# Patient Record
Sex: Female | Born: 1970 | Hispanic: No | Marital: Married | Smoking: Former smoker
Health system: Southern US, Community
[De-identification: ages and names within clinical notes are randomized; demographics above are authoritative.]

## PROBLEM LIST (undated history)

## (undated) HISTORY — PX: AUGMENTATION MAMMAPLASTY: SUR837

## (undated) HISTORY — PX: OTHER SURGICAL HISTORY: SHX169

---

## 2004-05-19 HISTORY — PX: BREAST ENHANCEMENT SURGERY: SHX7

## 2009-09-17 HISTORY — PX: SKIN CANCER EXCISION: SHX779

## 2009-09-17 HISTORY — PX: RECONSTRUCTION MID-FACE: SUR1085

## 2013-04-24 ENCOUNTER — Encounter (INDEPENDENT_AMBULATORY_CARE_PROVIDER_SITE_OTHER): Payer: Self-pay

## 2013-04-24 ENCOUNTER — Encounter (INDEPENDENT_AMBULATORY_CARE_PROVIDER_SITE_OTHER): Payer: Self-pay | Admitting: General Surgery

## 2013-04-24 ENCOUNTER — Ambulatory Visit (INDEPENDENT_AMBULATORY_CARE_PROVIDER_SITE_OTHER): Payer: BC Managed Care – PPO | Admitting: General Surgery

## 2013-04-24 ENCOUNTER — Telehealth (INDEPENDENT_AMBULATORY_CARE_PROVIDER_SITE_OTHER): Payer: Self-pay | Admitting: General Surgery

## 2013-04-24 VITALS — BP 120/62 | HR 62 | Temp 98.0°F | Resp 18 | Ht 64.0 in | Wt 115.0 lb

## 2013-04-24 DIAGNOSIS — K429 Umbilical hernia without obstruction or gangrene: Secondary | ICD-10-CM | POA: Insufficient documentation

## 2013-04-24 NOTE — Telephone Encounter (Signed)
I met with pt and went over financial responsibilities and benefits. Mrs. April Potts would like to discuss surgery dates with her husband and call me back to schedule. Placed in pending. skm
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
## 2013-04-24 NOTE — Patient Instructions (Signed)
[facility] _______[facility]  UMBILICAL HERNIA REPAIR: POST OP INSTRUCTIONS  Always review your discharge instruction sheet given to you by the facility where your surgery was performed. IF YOU HAVE DISABILITY OR FAMILY LEAVE FORMS, YOU MUST BRING THEM TO THE OFFICE FOR PROCESSING.   DO NOT GIVE THEM TO YOUR DOCTOR.  1. A  prescription for pain medication may be given to you upon discharge.  Take your pain medication as prescribed, if needed.  If narcotic pain medicine is not needed, then you may take acetaminophen (Tylenol) or ibuprofen (Advil) as needed. 2. Take your usually prescribed medications unless otherwise directed. 3. If you need a refill on your pain medication, please contact your pharmacy.  They will contact our office to request authorization. Prescriptions will not be filled after 5 pm or on week-ends. 4. You should follow a light diet the first 24 hours after arrival home, such as soup and crackers, etc.  Be sure to include lots of fluids daily.  Resume your normal diet the day after surgery. 5. Most patients will experience some swelling and bruising around the umbilicus.  Ice packs and reclining will help.  Swelling and bruising can take several days to resolve.  6. It is common to experience some constipation if taking pain medication after surgery.  Increasing fluid intake and taking a stool softener (such as Colace) will usually help or prevent this problem from occurring.  A mild laxative (Milk of Magnesia or Miralax) should be taken according to package directions if there are no bowel movements after 48 hours. 7. Unless discharge instructions indicate otherwise, you may remove your bandages 24-48 hours after surgery, and you may shower at that time.  You may have steri-strips (small skin tapes) in place directly over the incision.  These strips should be left on the skin for 7-10 days.  If your surgeon used skin glue on the incision, you may shower in 24 hours.  The  glue will flake off over the next 2-3 weeks.  Any sutures or staples will be removed at the office during your follow-up visit. 8. ACTIVITIES:  You may resume regular (light) daily activities beginning the next day-such as daily self-care, walking, climbing stairs-gradually increasing activities as tolerated.  You may have sexual intercourse when it is comfortable.  Refrain from any heavy lifting or straining until approved by your doctor. a. You may drive when you are no longer taking prescription pain medication, you can comfortably wear a seatbelt, and you can safely maneuver your car and apply brakes. b. RETURN TO WORK:  __________________________________________________________ 9. You should see your doctor in the office for a follow-up appointment approximately 2-3 weeks after your surgery.  Make sure that you call for this appointment within a day or two after you arrive home to insure a convenient appointment time. 10. OTHER INSTRUCTIONS:  __________________________________________________________________________________________________________________________________________________________________________________________  WHEN TO CALL YOUR DOCTOR: 1. Fever over 101.0 2. Inability to urinate 3. Nausea and/or vomiting 4. Extreme swelling or bruising 5. Continued bleeding from incision. 6. Increased pain, redness, or drainage from the incision  The clinic staff is available to answer your questions during regular business hours.  Please don't hesitate to call and ask to speak to one of the nurses for clinical concerns.  If you have a medical emergency, go to the nearest emergency room or call 911.  A surgeon from [facility] is always on call at the hospital   [address], [address], [place], [place]    [address] ?  [address] [address], [place], [place]   [address] [phone] ? [phone] ? FAX [phone] Web site: [email or web address] 

## 2013-04-24 NOTE — Telephone Encounter (Signed)
Noted  

## 2013-07-08 ENCOUNTER — Encounter (HOSPITAL_COMMUNITY): Payer: Self-pay | Admitting: Emergency Medicine

## 2013-07-08 ENCOUNTER — Emergency Department (HOSPITAL_COMMUNITY)
Admission: EM | Admit: 2013-07-08 | Discharge: 2013-07-09 | Disposition: A | Discharge status: Home / Self Care | Payer: BC Managed Care – PPO | Attending: Emergency Medicine | Admitting: Emergency Medicine

## 2013-07-08 DIAGNOSIS — S1093XA Contusion of unspecified part of neck, initial encounter: Principal | ICD-10-CM

## 2013-07-08 DIAGNOSIS — Z87891 Personal history of nicotine dependence: Secondary | ICD-10-CM | POA: Insufficient documentation

## 2013-07-08 DIAGNOSIS — S0083XA Contusion of other part of head, initial encounter: Secondary | ICD-10-CM

## 2013-07-08 DIAGNOSIS — S0003XA Contusion of scalp, initial encounter: Secondary | ICD-10-CM | POA: Insufficient documentation

## 2013-07-08 DIAGNOSIS — Z79899 Other long term (current) drug therapy: Secondary | ICD-10-CM | POA: Insufficient documentation
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
## 2013-07-09 ENCOUNTER — Emergency Department (HOSPITAL_COMMUNITY): Payer: BC Managed Care – PPO

## 2013-07-09 MED ORDER — IBUPROFEN 800 MG PO TABS
800.0000 mg | ORAL_TABLET | Freq: Once | ORAL | Status: AC
  Administered 2013-07-09: 800 mg via ORAL

## 2013-07-09 MED ORDER — IBUPROFEN 800 MG PO TABS
ORAL_TABLET | ORAL | Status: AC
  Filled 2013-07-09: qty 1

## 2013-07-09 NOTE — ED Provider Notes (Signed)
Medical screening examination/treatment/procedure(s) were performed by non-physician practitioner and as supervising physician I was immediately available for consultation/collaboration.  EKG Interpretation   None         Lyanne CoKevin M Rylen Swindler, MD 07/09/13 [phone]

## 2013-07-09 NOTE — ED Notes (Signed)
Patient is alert and oriented x3.  She was given DC instructions and follow up visit instructions.  Patient gave verbal understanding. She was DC ambulatory under her own power to home.  V/S stable.  He was not showing any signs of distress on DC 
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
## 2013-07-09 NOTE — Discharge Instructions (Signed)
Assault, General Assault includes any behavior, whether intentional or reckless, which results in bodily injury to another person and/or damage to property. Included in this would be any behavior, intentional or reckless, that by its nature would be understood (interpreted) by a reasonable person as intent to harm another person or to damage his/her property. Threats may be oral or written. They may be communicated through regular mail, computer, fax, or phone. These threats may be direct or implied. FORMS OF ASSAULT INCLUDE:  Physically assaulting a person. This includes physical threats to inflict physical harm as well as:  Slapping.  Hitting.  Poking.  Kicking.  Punching.  Pushing.  Arson.  Sabotage.  Equipment vandalism.  Damaging or destroying property.  Throwing or hitting objects.  Displaying a weapon or an object that appears to be a weapon in a threatening manner.  Carrying a firearm of any kind.  Using a weapon to harm someone.  Using greater physical size/strength to intimidate another.  Making intimidating or threatening gestures.  Bullying.  Hazing.  Intimidating, threatening, hostile, or abusive language directed toward another person.  It communicates the intention to engage in violence against that person. And it leads a reasonable person to expect that violent behavior may occur.  Stalking another person. IF IT HAPPENS AGAIN:  Immediately call for emergency help (911 in [place]  If someone poses clear and immediate danger to you, seek legal authorities to have a protective or restraining order put in place.  Less threatening assaults can at least be reported to authorities. STEPS TO TAKE IF A SEXUAL ASSAULT HAS HAPPENED  Go to an area of safety. This may include a shelter or staying with a friend. Stay away from the area where you have been attacked. A large percentage of sexual assaults are caused by a friend, relative or associate.  If  medications were given by your caregiver, take them as directed for the full length of time prescribed.  Only take over-the-counter or prescription medicines for pain, discomfort, or fever as directed by your caregiver.  If you have come in contact with a sexual disease, find out if you are to be tested again. If your caregiver is concerned about the HIV/AIDS virus, he/she may require you to have continued testing for several months.  For the protection of your privacy, test results can not be given over the phone. Make sure you receive the results of your test. If your test results are not back during your visit, make an appointment with your caregiver to find out the results. Do not assume everything is normal if you have not heard from your caregiver or the medical facility. It is important for you to follow up on all of your test results.  File appropriate papers with authorities. This is important in all assaults, even if it has occurred in a family or by a friend. SEEK MEDICAL CARE IF:  You have new problems because of your injuries.  You have problems that may be because of the medicine you are taking, such as:  Rash.  Itching.  Swelling.  Trouble breathing.  You develop belly (abdominal) pain, feel sick to your stomach (nausea) or are vomiting.  You begin to run a temperature.  You need supportive care or referral to a rape crisis center. These are centers with trained personnel who can help you get through this ordeal. SEEK IMMEDIATE MEDICAL CARE IF:  You are afraid of being threatened, beaten, or abused. In [place] call 911.  You  receive new injuries related to abuse.  You develop severe pain in any area injured in the assault or have any change in your condition that concerns you.  You faint or lose consciousness.  You develop chest pain or shortness of breath. Document Released: 06/05/2005 Document Revised: 08/28/2011 Document Reviewed: 01/22/2008 [facility] Patient  Information 2014 [place], [place].  Contusion A contusion is a deep bruise. Contusions happen when an injury causes bleeding under the skin. Signs of bruising include pain, puffiness (swelling), and discolored skin. The contusion may turn blue, purple, or yellow. HOME CARE   Put ice on the injured area.  Put ice in a plastic bag.  Place a towel between your skin and the bag.  Leave the ice on for 15-20 minutes, 03-04 times a day.  Only take medicine as told by your doctor.  Rest the injured area.  If possible, raise (elevate) the injured area to lessen puffiness. GET HELP RIGHT AWAY IF:   You have more bruising or puffiness.  You have pain that is getting worse.  Your puffiness or pain is not helped by medicine. MAKE SURE YOU:   Understand these instructions.  Will watch your condition.  Will get help right away if you are not doing well or get worse. Document Released: 11/22/2007 Document Revised: 08/28/2011 Document Reviewed: 04/10/2011 [facility] Patient Information 2014 [place], [place]. The CT scan, done of your face, reveals no fractures
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
## 2019-08-04 ENCOUNTER — Other Ambulatory Visit: Payer: Self-pay | Admitting: Obstetrics and Gynecology

## 2019-08-04 DIAGNOSIS — R928 Other abnormal and inconclusive findings on diagnostic imaging of breast: Secondary | ICD-10-CM

## 2019-08-11 ENCOUNTER — Ambulatory Visit
Admission: RE | Admit: 2019-08-11 | Discharge: 2019-08-11 | Disposition: A | Discharge status: Home / Self Care | Payer: 59 | Source: Ambulatory Visit | Attending: Obstetrics and Gynecology | Admitting: Obstetrics and Gynecology

## 2019-08-11 ENCOUNTER — Other Ambulatory Visit: Payer: Self-pay

## 2019-08-11 ENCOUNTER — Other Ambulatory Visit: Payer: Self-pay | Admitting: Obstetrics and Gynecology

## 2019-08-11 DIAGNOSIS — R928 Other abnormal and inconclusive findings on diagnostic imaging of breast: Secondary | ICD-10-CM

## 2019-08-11 DIAGNOSIS — N6489 Other specified disorders of breast: Secondary | ICD-10-CM

## 2019-08-15 ENCOUNTER — Other Ambulatory Visit: Payer: Self-pay | Admitting: Obstetrics and Gynecology

## 2019-08-15 DIAGNOSIS — Z9189 Other specified personal risk factors, not elsewhere classified: Secondary | ICD-10-CM

## 2020-02-09 ENCOUNTER — Other Ambulatory Visit: Payer: Self-pay | Admitting: Obstetrics and Gynecology

## 2020-02-09 ENCOUNTER — Ambulatory Visit
Admission: RE | Admit: 2020-02-09 | Discharge: 2020-02-09 | Disposition: A | Discharge status: Home / Self Care | Payer: No Typology Code available for payment source | Source: Ambulatory Visit | Attending: Obstetrics and Gynecology | Admitting: Obstetrics and Gynecology

## 2020-02-09 ENCOUNTER — Other Ambulatory Visit: Payer: Self-pay

## 2020-02-09 ENCOUNTER — Ambulatory Visit
Admission: RE | Admit: 2020-02-09 | Discharge: 2020-02-09 | Disposition: A | Discharge status: Home / Self Care | Payer: 59 | Source: Ambulatory Visit | Attending: Obstetrics and Gynecology | Admitting: Obstetrics and Gynecology

## 2020-02-09 DIAGNOSIS — N6489 Other specified disorders of breast: Secondary | ICD-10-CM

## 2020-08-12 ENCOUNTER — Ambulatory Visit
Admission: RE | Admit: 2020-08-12 | Discharge: 2020-08-12 | Disposition: A | Discharge status: Home / Self Care | Payer: No Typology Code available for payment source | Source: Ambulatory Visit | Attending: Obstetrics and Gynecology | Admitting: Obstetrics and Gynecology

## 2020-08-12 ENCOUNTER — Other Ambulatory Visit: Payer: Self-pay

## 2020-08-12 ENCOUNTER — Other Ambulatory Visit: Payer: Self-pay | Admitting: Obstetrics and Gynecology

## 2020-08-12 DIAGNOSIS — N6489 Other specified disorders of breast: Secondary | ICD-10-CM

## 2021-06-15 IMAGING — MG MM DIGITAL DIAGNOSTIC UNILAT*R* IMPLANT W/ TOMO W/ CAD
6 series · 6 of 14 positions shown · non-contrast
Comparison: Previous exam(s).

CLINICAL DATA: Short-term interval follow-up of probable benign
findings in the right breast.

EXAM:
DIGITAL DIAGNOSTIC RIGHT MAMMOGRAM WITH IMPLANTS, CAD AND TOMO
ULTRASOUND RIGHT BREAST
The patient has retropectoral implants. Standard and implant
displaced views were performed.

[R MLO]
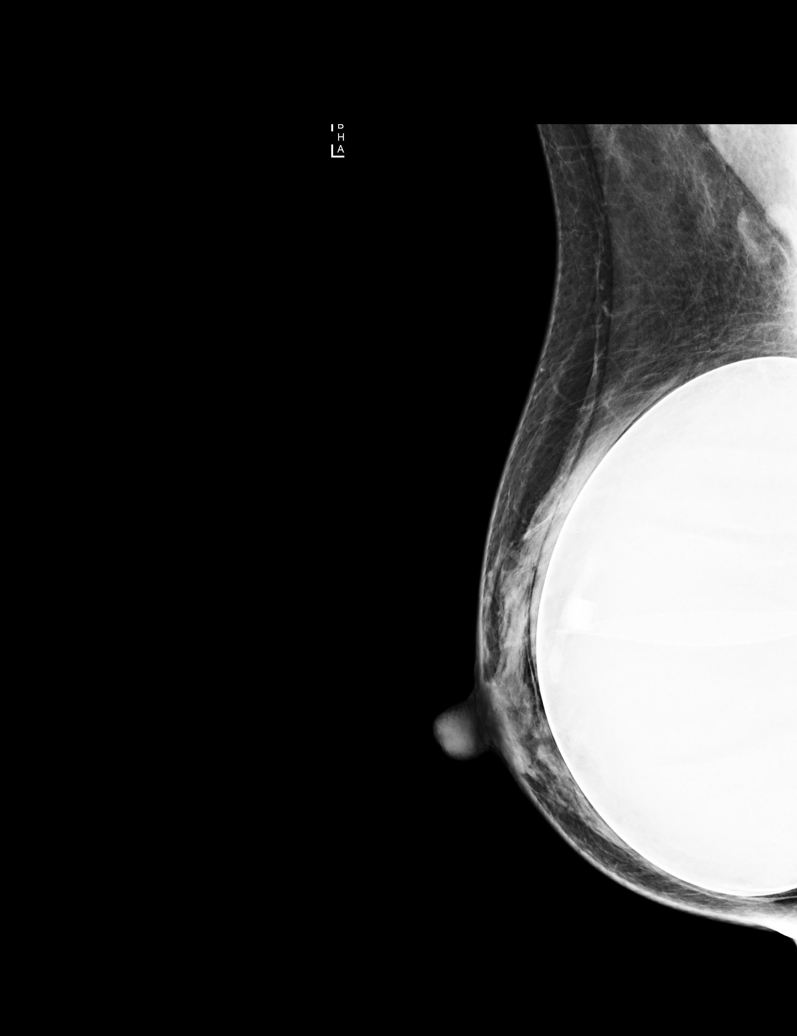

[R CC]
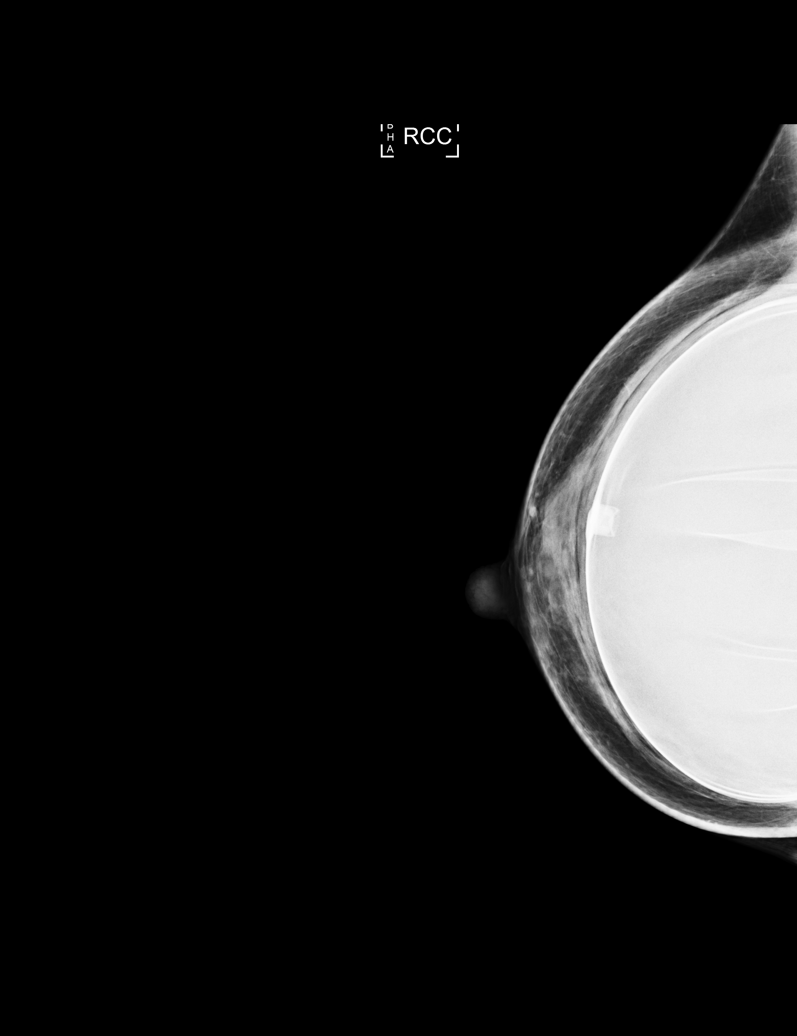

[R CC synth-2D]
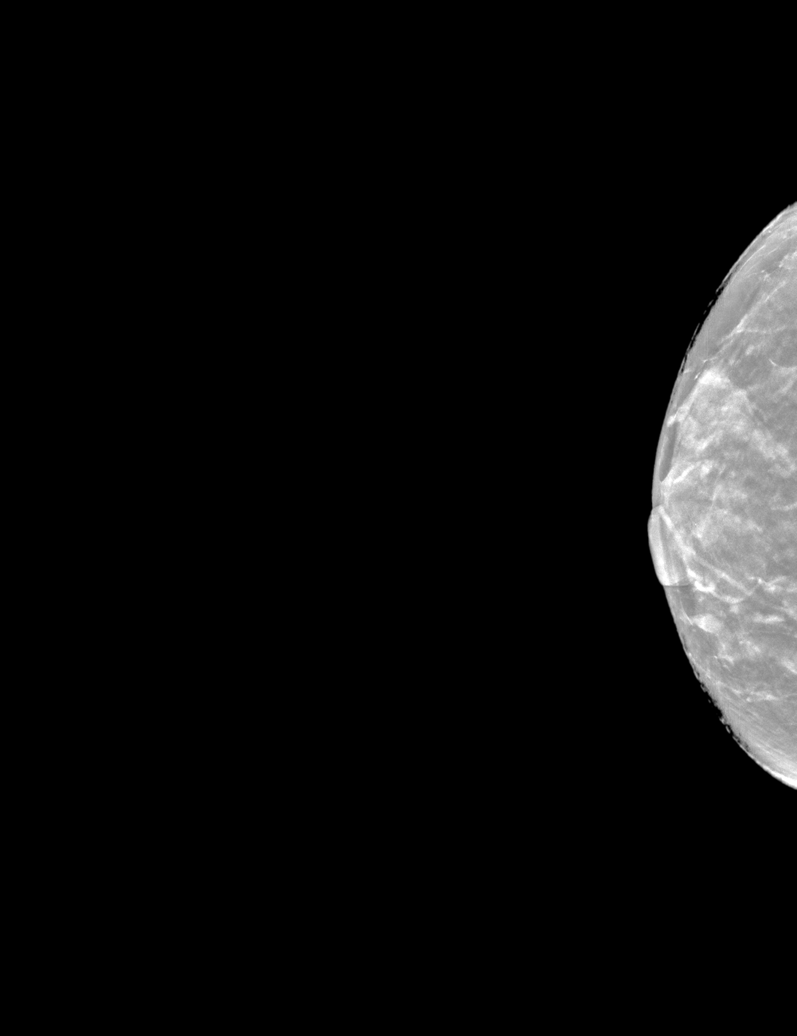

[R MLO synth-2D]
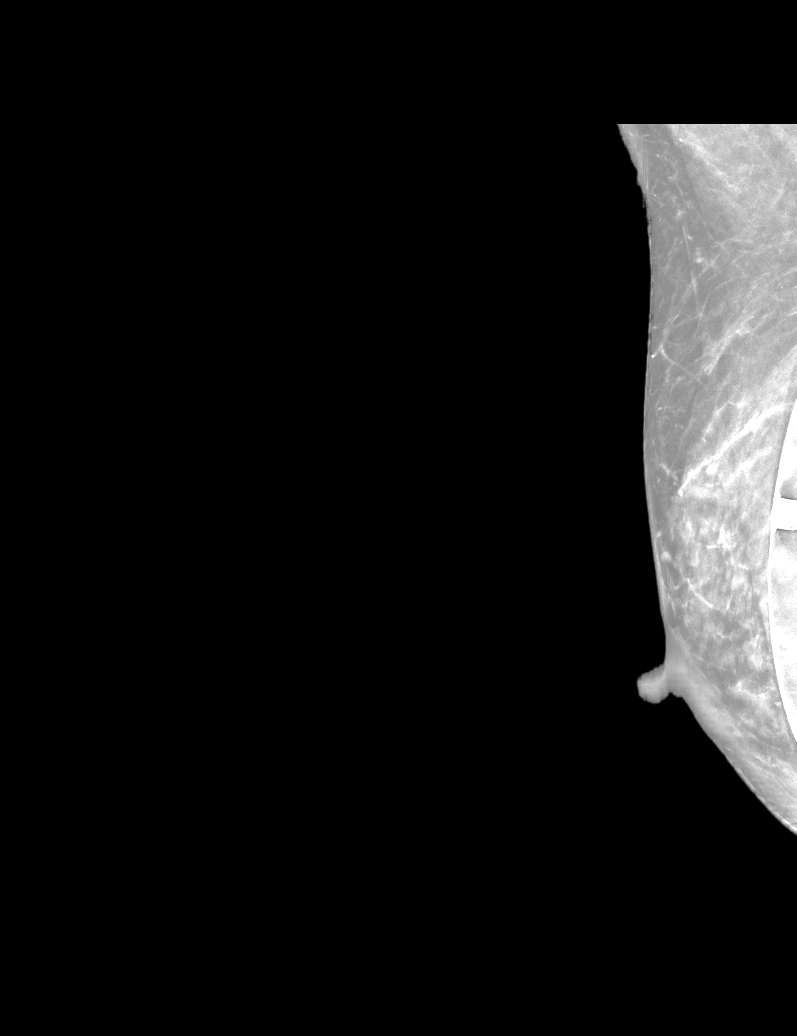

[R CCID BREAST TOMOSYNTHESIS IMAGE tomo · tomo slice 21/41.0]
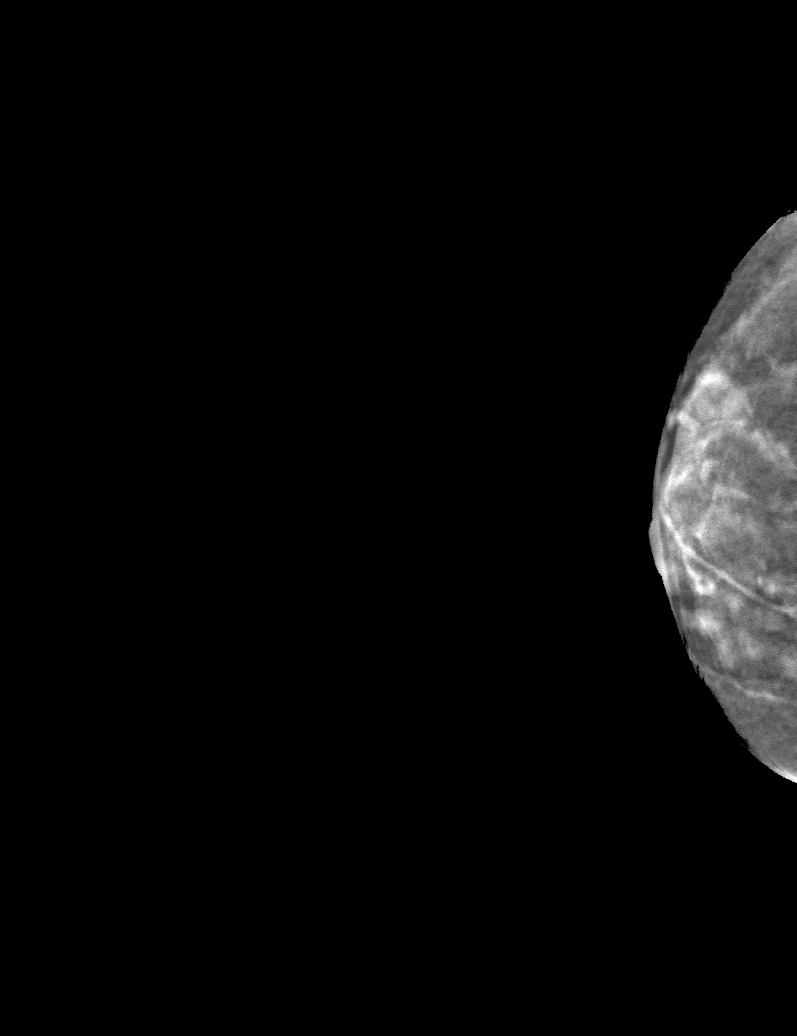

[R MLOID BREAST TOMOSYNTHESIS IMAGE tomo · tomo slice 27/54.0]
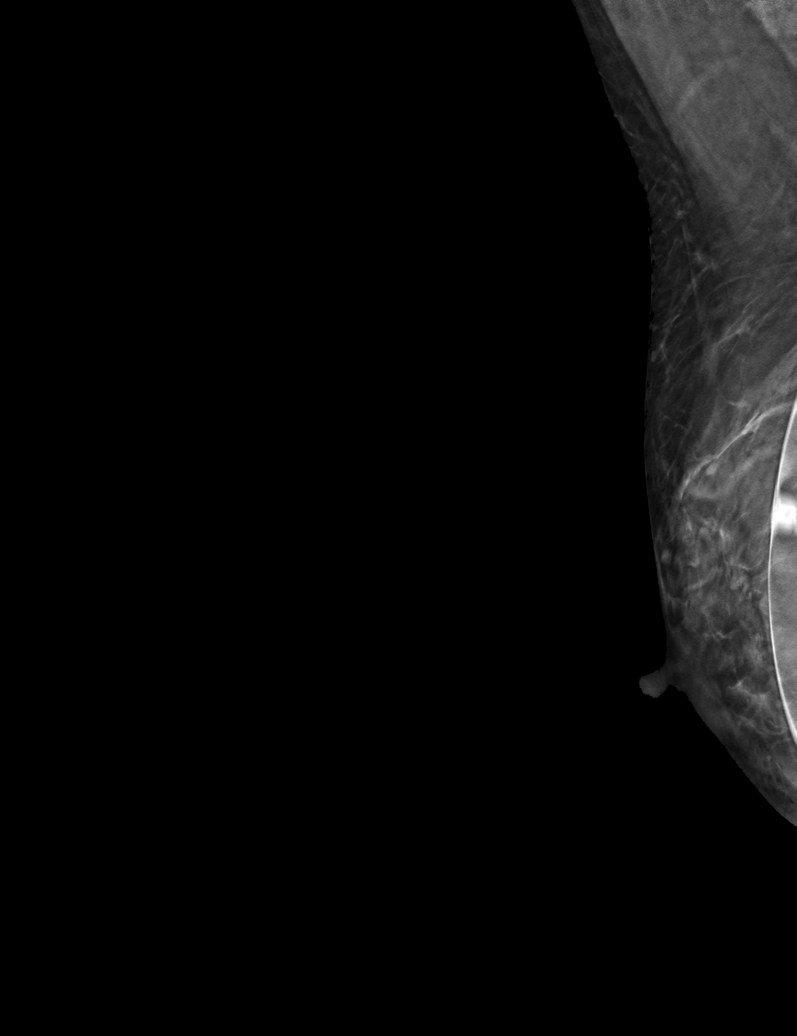

[6 of 14 positions shown; findings below may reference images not displayed]

ACR Breast Density Category c: The breast tissue is heterogeneously
dense, which may obscure small masses.
FINDINGS: Asymmetry in the medial aspect of the right breast is less
conspicuous on today's images. No suspicious mass or malignant type
microcalcifications identified.

Mammographic images were processed with CAD.

On physical exam, I do not palpate a discrete mass in the
lower-inner quadrant of the right breast.

Targeted ultrasound is performed, showing probable benign cluster of
cysts (apocrine metaplasia) in the right breast at 4 o'clock in the
subareolar location measuring 5 x 2 x 4 mm.
IMPRESSION: Probable benign cysts in the right breast.

RECOMMENDATION:
Short-term interval follow-up bilateral mammogram and right breast
ultrasound in Monday July, 2020 is recommended.

I have discussed the findings and recommendations with the patient.
If applicable, a reminder letter will be sent to the patient
regarding the next appointment.

BI-RADS CATEGORY  3: Probably benign.

## 2021-07-26 ENCOUNTER — Other Ambulatory Visit: Payer: Self-pay | Admitting: Obstetrics and Gynecology

## 2021-07-26 DIAGNOSIS — N6489 Other specified disorders of breast: Secondary | ICD-10-CM

## 2021-08-18 ENCOUNTER — Ambulatory Visit
Admission: RE | Admit: 2021-08-18 | Discharge: 2021-08-18 | Disposition: A | Discharge status: Home / Self Care | Payer: BC Managed Care – PPO | Source: Ambulatory Visit | Attending: Obstetrics and Gynecology | Admitting: Obstetrics and Gynecology

## 2021-08-18 ENCOUNTER — Ambulatory Visit
Admission: RE | Admit: 2021-08-18 | Discharge: 2021-08-18 | Disposition: A | Discharge status: Home / Self Care | Payer: 59 | Source: Ambulatory Visit | Attending: Obstetrics and Gynecology | Admitting: Obstetrics and Gynecology

## 2021-08-18 DIAGNOSIS — N6489 Other specified disorders of breast: Secondary | ICD-10-CM

## 2021-12-17 IMAGING — US US BREAST*R* LIMITED INC AXILLA
2 series · 13 of 23 positions shown · non-contrast
Comparison: Previous exam(s).

CLINICAL DATA: Follow-up for probably benign cysts in the RIGHT
breast. A mammographic asymmetry within the RIGHT breast was
originally identified on screening mammogram dated 07/31/2019.

EXAM:
DIGITAL DIAGNOSTIC BILATERAL MAMMOGRAM WITH IMPLANTS, CAD AND
TOMOSYNTHESIS; ULTRASOUND RIGHT BREAST LIMITED
TECHNIQUE: Bilateral digital diagnostic mammography and breast tomosynthesis
was performed. The images were evaluated with computer-aided
detection. Standard and/or implant displaced views were performed.;
Targeted ultrasound examination of the right breast was performed

[Series 1: us breast*right* limited inc axilla · 0.05mm/px · 7 of 12 slices shown (1 of 2)]
[im 1/12]
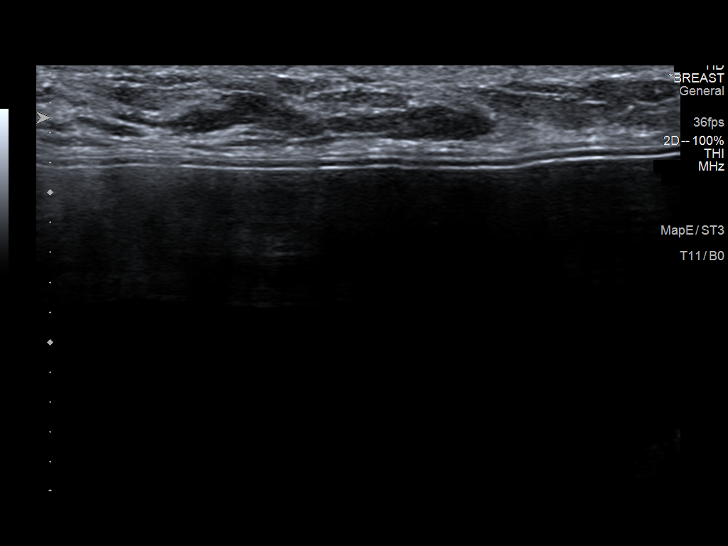
[im 3/12]
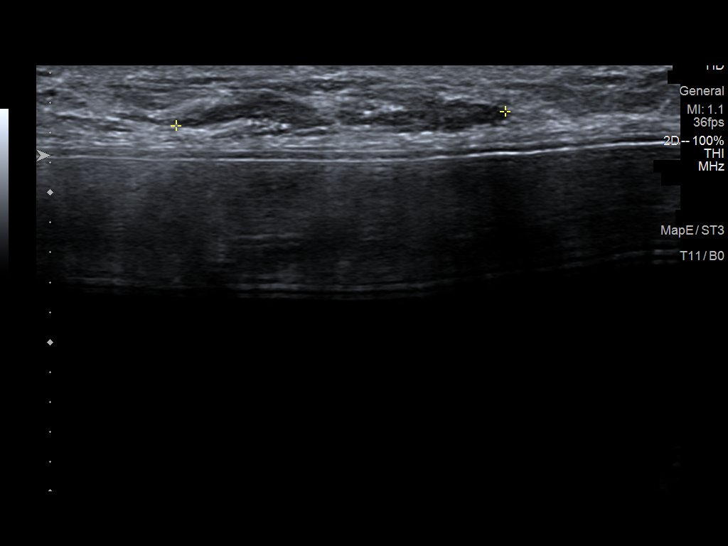
[im 5/12]
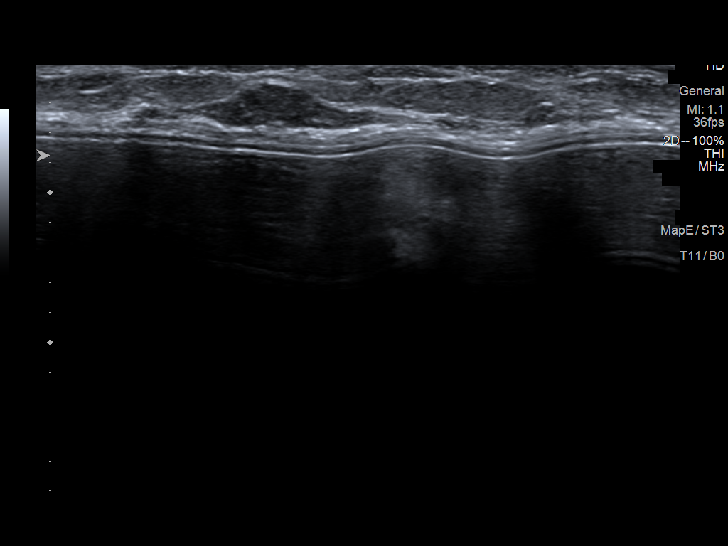
[im 7/12]
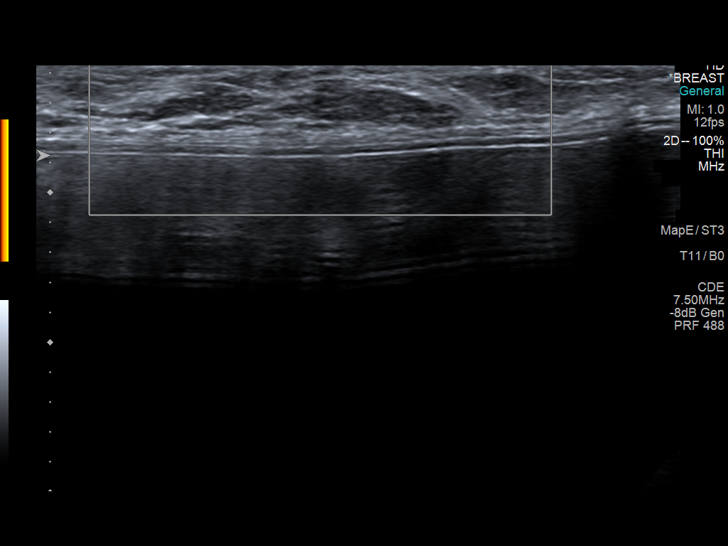
[im 8/12]
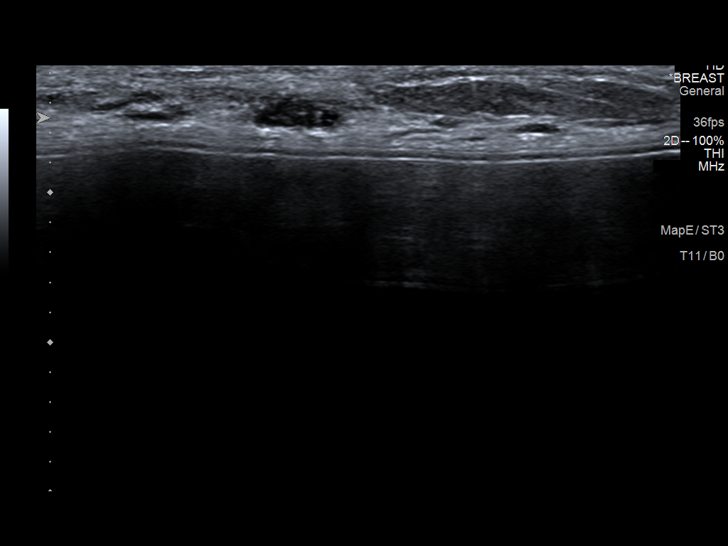
[im 10/12]
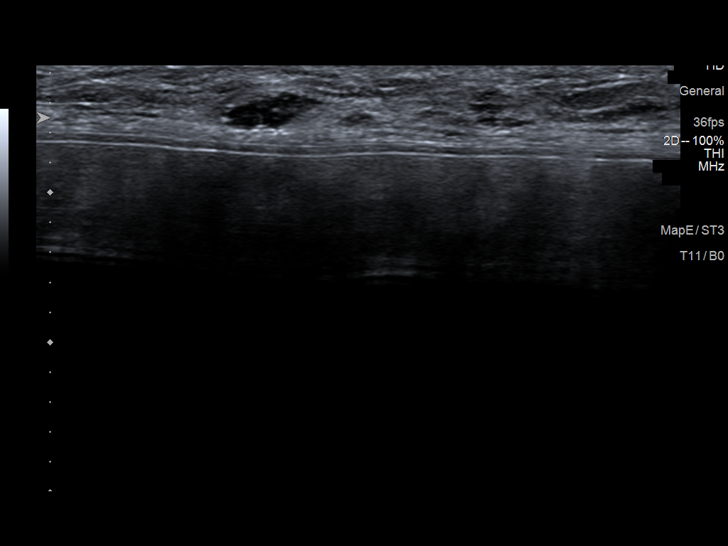
[im 12/12]
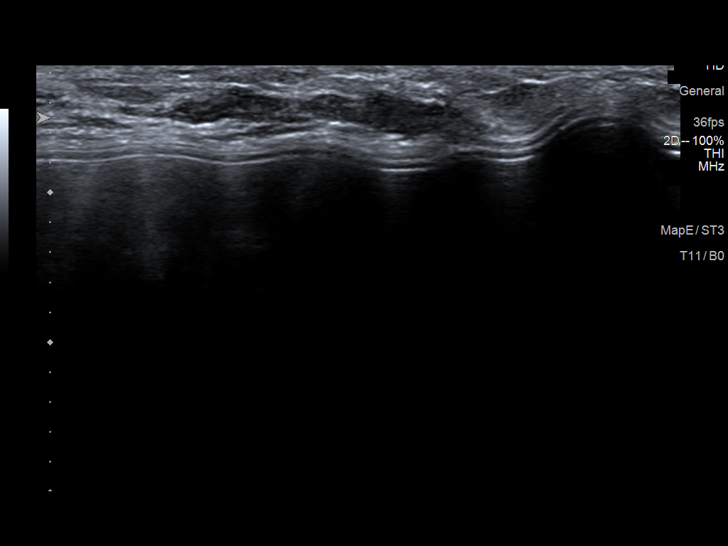

[Series 2: us breast*right* limited inc axilla · 0.05mm/px · 6 of 11 slices shown (2 of 2)]
[im 2/11]
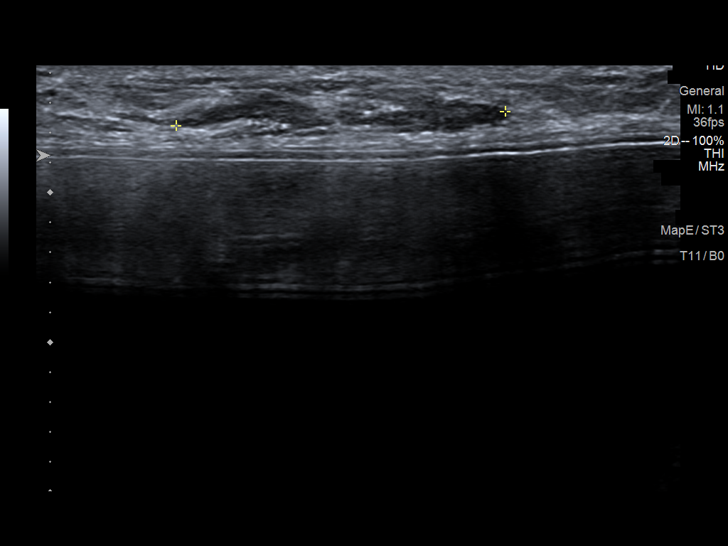
[im 4/11]
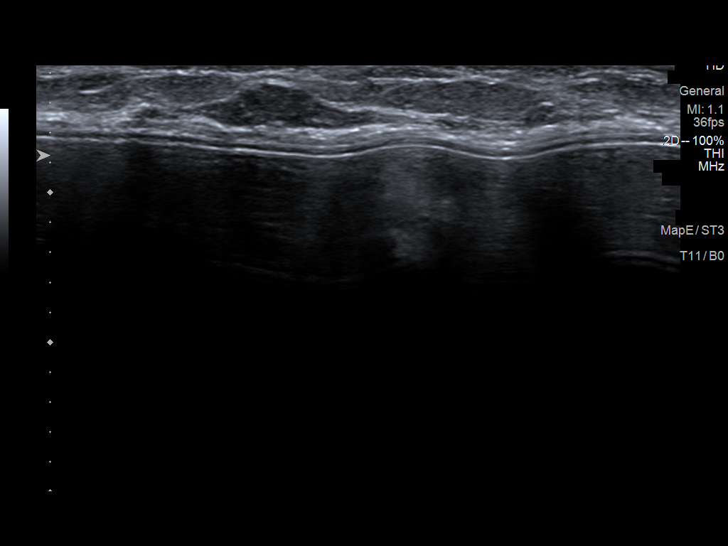
[im 5/11]
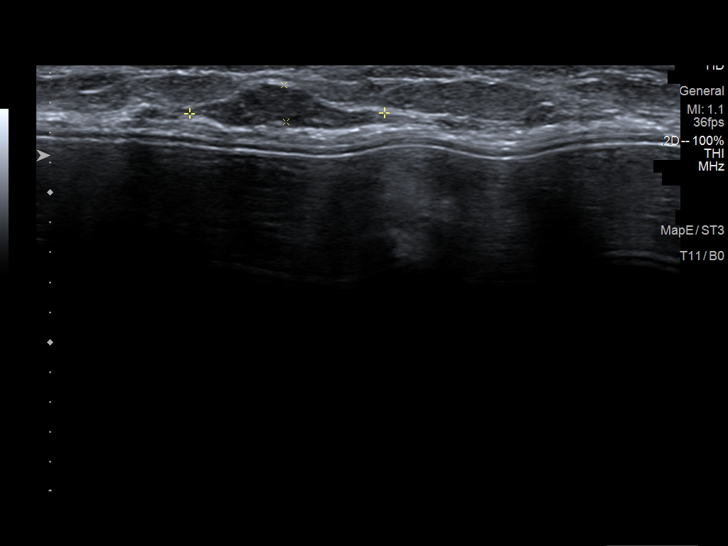
[im 7/11]
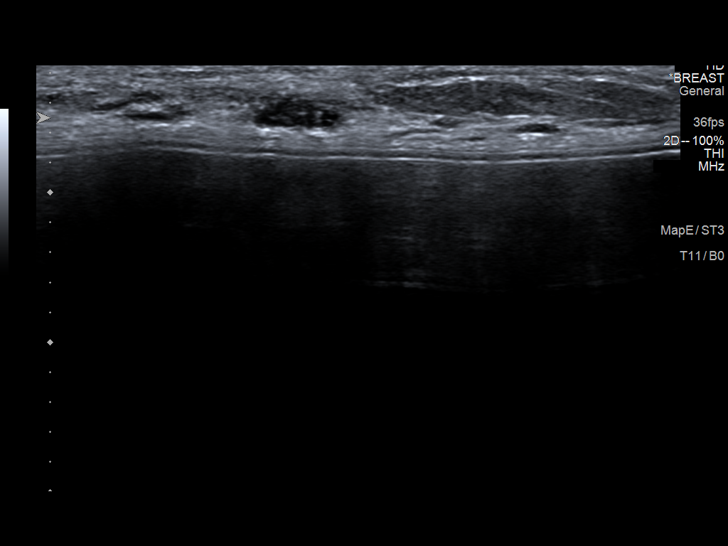
[im 9/11]
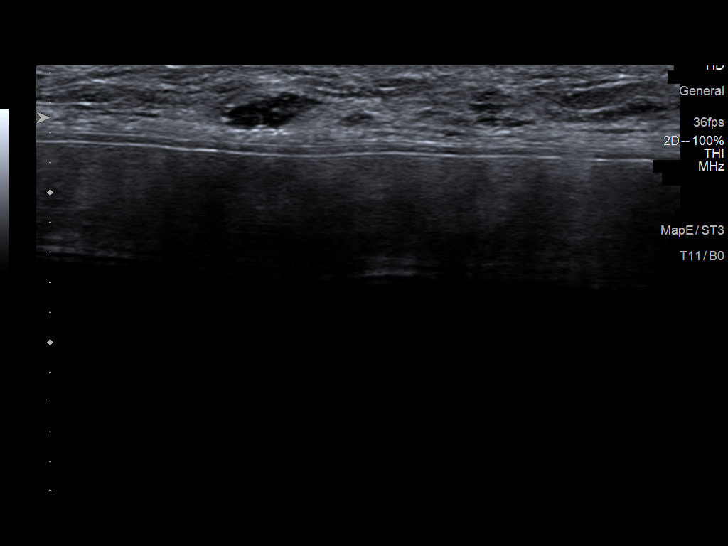
[im 11/11]
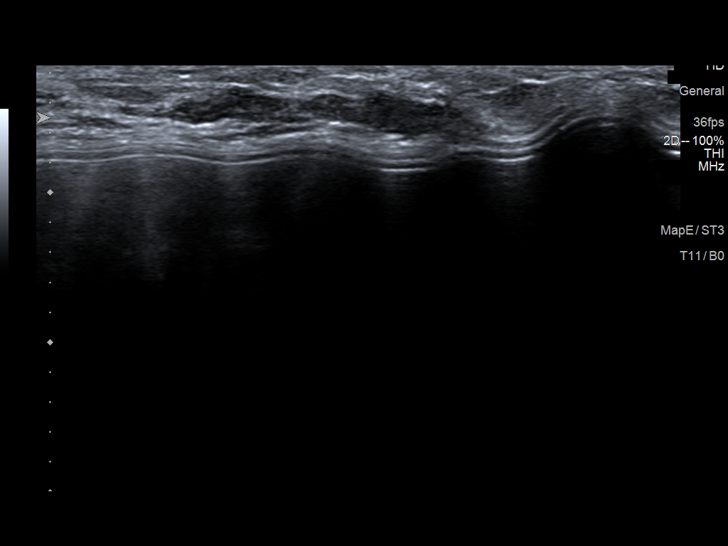

[13 of 23 positions shown; findings below may reference images not displayed]

ACR Breast Density Category c: The breast tissue is heterogeneously
dense, which may obscure small masses.
FINDINGS: Bilateral diagnostic mammogram: The originally questioned asymmetry
within the inner RIGHT breast is not significantly changed. There
are no new dominant masses, suspicious calcifications or secondary
signs of malignancy identified within either breast.

The patient has retropectoral implants.

Targeted ultrasound is performed, again showing the probably benign
cluster of microcysts in the subareolar RIGHT breast, 4 o'clock
axis, measuring 6 x 2 x 5 mm, not significantly changed compared to
the previous ultrasound of 02/09/2020.

Additional hypoechoic area is identified in the RIGHT breast at the
4 o'clock axis, 3 cm from the nipple, measuring 2.2 x 0.3 x 1.3 cm,
without internal vascularity, most suggestive of normal fat lobules
underlying normal dense fibroglandular tissue during real-time
ultrasound evaluation, corresponding to the mammographic appearance.

No suspicious solid or cystic mass is identified by ultrasound
IMPRESSION: 1. Probably benign cluster of microcysts in the subareolar RIGHT
breast, 4 o'clock axis, measuring 6 mm, not significantly changed
compared to previous exam.
2. Probably benign hypoechoic area in the RIGHT breast at the 4
o'clock axis, 3 cm from the nipple, measuring 2.2 x 0.3 x 1.3 cm,
most suggestive of normal benign fat lobules underlying normal dense
fibroglandular tissues, corresponding to the mammographic
appearance.
3. No evidence of malignancy within the LEFT breast.

Recommend follow-up diagnostic mammogram and ultrasound in 12 months
to ensure 2 year stability.

RECOMMENDATION:
Bilateral diagnostic mammogram, and RIGHT breast ultrasound, in 12
months.

I have discussed the findings and recommendations with the patient.
If applicable, a reminder letter will be sent to the patient
regarding the next appointment.

BI-RADS CATEGORY  3: Probably benign.

## 2022-09-01 ENCOUNTER — Other Ambulatory Visit: Payer: Self-pay | Admitting: Orthopedic Surgery

## 2022-09-22 ENCOUNTER — Encounter (HOSPITAL_BASED_OUTPATIENT_CLINIC_OR_DEPARTMENT_OTHER): Payer: Self-pay | Admitting: Orthopedic Surgery

## 2022-09-22 ENCOUNTER — Other Ambulatory Visit: Payer: Self-pay

## 2022-09-29 ENCOUNTER — Encounter (HOSPITAL_BASED_OUTPATIENT_CLINIC_OR_DEPARTMENT_OTHER): Payer: Self-pay | Admitting: Orthopedic Surgery

## 2022-09-29 ENCOUNTER — Encounter (HOSPITAL_BASED_OUTPATIENT_CLINIC_OR_DEPARTMENT_OTHER)
Admission: RE | Disposition: A | Discharge status: Home / Self Care | Payer: Self-pay | Source: Home / Self Care | Attending: Orthopedic Surgery

## 2022-09-29 ENCOUNTER — Ambulatory Visit (HOSPITAL_BASED_OUTPATIENT_CLINIC_OR_DEPARTMENT_OTHER): Payer: BC Managed Care – PPO | Admitting: Anesthesiology

## 2022-09-29 ENCOUNTER — Ambulatory Visit (HOSPITAL_BASED_OUTPATIENT_CLINIC_OR_DEPARTMENT_OTHER)
Admission: RE | Admit: 2022-09-29 | Discharge: 2022-09-29 | Disposition: A | Discharge status: Home / Self Care | Payer: BC Managed Care – PPO | Attending: Orthopedic Surgery | Admitting: Orthopedic Surgery

## 2022-09-29 ENCOUNTER — Other Ambulatory Visit: Payer: Self-pay

## 2022-09-29 DIAGNOSIS — D1801 Hemangioma of skin and subcutaneous tissue: Secondary | ICD-10-CM | POA: Insufficient documentation

## 2022-09-29 DIAGNOSIS — Z87891 Personal history of nicotine dependence: Secondary | ICD-10-CM | POA: Insufficient documentation

## 2022-09-29 DIAGNOSIS — Z01818 Encounter for other preprocedural examination: Secondary | ICD-10-CM

## 2022-09-29 DIAGNOSIS — R2232 Localized swelling, mass and lump, left upper limb: Secondary | ICD-10-CM | POA: Diagnosis present

## 2022-09-29 HISTORY — PX: EXCISION METACARPAL MASS: SHX6372

## 2022-09-29 LAB — POCT PREGNANCY, URINE: Preg Test, Ur: NEGATIVE

## 2022-09-29 SURGERY — EXCISION METACARPAL MASS
Anesthesia: Monitor Anesthesia Care | Site: Ring Finger | Laterality: Left

## 2022-09-29 MED ORDER — ONDANSETRON HCL 4 MG/2ML IJ SOLN: 4.0000 mg | Freq: Four times a day (QID) | INTRAMUSCULAR | Status: DC | PRN

## 2022-09-29 MED ORDER — VANCOMYCIN HCL IN DEXTROSE 1-5 GM/200ML-% IV SOLN
INTRAVENOUS | Status: AC
  Filled 2022-09-29: qty 200

## 2022-09-29 MED ORDER — FENTANYL CITRATE (PF) 100 MCG/2ML IJ SOLN
INTRAMUSCULAR | Status: DC | PRN
  Administered 2022-09-29: 50 ug via INTRAVENOUS

## 2022-09-29 MED ORDER — FENTANYL CITRATE (PF) 100 MCG/2ML IJ SOLN
INTRAMUSCULAR | Status: AC
  Filled 2022-09-29: qty 2

## 2022-09-29 MED ORDER — MIDAZOLAM HCL 2 MG/2ML IJ SOLN
INTRAMUSCULAR | Status: DC | PRN
  Administered 2022-09-29: 2 mg via INTRAVENOUS

## 2022-09-29 MED ORDER — DIPHENHYDRAMINE HCL 50 MG/ML IJ SOLN
INTRAMUSCULAR | Status: AC
  Filled 2022-09-29: qty 1

## 2022-09-29 MED ORDER — BUPIVACAINE HCL (PF) 0.25 % IJ SOLN
INTRAMUSCULAR | Status: DC | PRN
  Administered 2022-09-29: 9 mL

## 2022-09-29 MED ORDER — VANCOMYCIN HCL IN DEXTROSE 1-5 GM/200ML-% IV SOLN
1000.0000 mg | INTRAVENOUS | Status: AC
  Administered 2022-09-29: 1000 mg via INTRAVENOUS

## 2022-09-29 MED ORDER — OXYCODONE HCL 5 MG/5ML PO SOLN: 5.0000 mg | Freq: Once | ORAL | Status: DC | PRN

## 2022-09-29 MED ORDER — PROPOFOL 500 MG/50ML IV EMUL
INTRAVENOUS | Status: AC
  Filled 2022-09-29: qty 50

## 2022-09-29 MED ORDER — DIPHENHYDRAMINE HCL 50 MG/ML IJ SOLN: 12.5000 mg | Freq: Once | INTRAMUSCULAR | Status: DC

## 2022-09-29 MED ORDER — DIPHENHYDRAMINE HCL 50 MG/ML IJ SOLN
12.5000 mg | Freq: Once | INTRAMUSCULAR | Status: AC
  Administered 2022-09-29: 12.5 mg via INTRAVENOUS

## 2022-09-29 MED ORDER — LACTATED RINGERS IV SOLN: INTRAVENOUS | Status: DC

## 2022-09-29 MED ORDER — 0.9 % SODIUM CHLORIDE (POUR BTL) OPTIME
TOPICAL | Status: DC | PRN
  Administered 2022-09-29: 200 mL

## 2022-09-29 MED ORDER — ONDANSETRON HCL 4 MG/2ML IJ SOLN
INTRAMUSCULAR | Status: DC | PRN
  Administered 2022-09-29: 4 mg via INTRAVENOUS

## 2022-09-29 MED ORDER — TRAMADOL HCL 50 MG PO TABS: ORAL_TABLET | ORAL | 0 refills | Status: AC

## 2022-09-29 MED ORDER — PROPOFOL 500 MG/50ML IV EMUL
INTRAVENOUS | Status: DC | PRN
  Administered 2022-09-29: 150 ug/kg/min via INTRAVENOUS

## 2022-09-29 MED ORDER — FENTANYL CITRATE (PF) 100 MCG/2ML IJ SOLN: 25.0000 ug | INTRAMUSCULAR | Status: DC | PRN

## 2022-09-29 MED ORDER — MIDAZOLAM HCL 2 MG/2ML IJ SOLN
INTRAMUSCULAR | Status: AC
  Filled 2022-09-29: qty 2

## 2022-09-29 MED ORDER — ONDANSETRON HCL 4 MG/2ML IJ SOLN
INTRAMUSCULAR | Status: AC
  Filled 2022-09-29: qty 2

## 2022-09-29 MED ORDER — OXYCODONE HCL 5 MG PO TABS: 5.0000 mg | ORAL_TABLET | Freq: Once | ORAL | Status: DC | PRN

## 2022-09-29 SURGICAL SUPPLY — 56 items
APL PRP STRL LF DISP 70% ISPRP (MISCELLANEOUS) ×1
APL SKNCLS STERI-STRIP NONHPOA (GAUZE/BANDAGES/DRESSINGS)
BANDAGE GAUZE 1X75IN STRL (MISCELLANEOUS) IMPLANT
BENZOIN TINCTURE PRP APPL 2/3 (GAUZE/BANDAGES/DRESSINGS) IMPLANT
BLADE MINI RND TIP GREEN BEAV (BLADE) IMPLANT
BLADE SURG 15 STRL LF DISP TIS (BLADE) ×2 IMPLANT
BLADE SURG 15 STRL SS (BLADE) ×2
BNDG CMPR 5X2 CHSV 1 LYR STRL (GAUZE/BANDAGES/DRESSINGS)
BNDG CMPR 5X2 KNTD ELC UNQ LF (GAUZE/BANDAGES/DRESSINGS)
BNDG CMPR 5X3 KNIT ELC UNQ LF (GAUZE/BANDAGES/DRESSINGS)
BNDG CMPR 75X11 PLY HI ABS (MISCELLANEOUS)
BNDG CMPR 75X21 PLY HI ABS (MISCELLANEOUS)
BNDG CMPR 9X4 STRL LF SNTH (GAUZE/BANDAGES/DRESSINGS)
BNDG COHESIVE 1X5 TAN STRL LF (GAUZE/BANDAGES/DRESSINGS) IMPLANT
BNDG COHESIVE 2X5 TAN ST LF (GAUZE/BANDAGES/DRESSINGS) IMPLANT
BNDG ELASTIC 2INX 5YD STR LF (GAUZE/BANDAGES/DRESSINGS) IMPLANT
BNDG ELASTIC 3INX 5YD STR LF (GAUZE/BANDAGES/DRESSINGS) IMPLANT
BNDG ESMARK 4X9 LF (GAUZE/BANDAGES/DRESSINGS) IMPLANT
BNDG GAUZE 1X75IN STRL (MISCELLANEOUS)
BNDG GAUZE DERMACEA FLUFF 4 (GAUZE/BANDAGES/DRESSINGS) IMPLANT
BNDG GZE DERMACEA 4 6PLY (GAUZE/BANDAGES/DRESSINGS)
BNDG PLASTER X FAST 3X3 WHT LF (CAST SUPPLIES) IMPLANT
BNDG PLSTR 9X3 FST ST WHT (CAST SUPPLIES)
CHLORAPREP W/TINT 26 (MISCELLANEOUS) ×1 IMPLANT
CORD BIPOLAR FORCEPS 12FT (ELECTRODE) ×1 IMPLANT
COVER BACK TABLE 60X90IN (DRAPES) ×1 IMPLANT
COVER MAYO STAND STRL (DRAPES) ×1 IMPLANT
CUFF TOURN SGL QUICK 18X4 (TOURNIQUET CUFF) ×1 IMPLANT
DRAPE EXTREMITY T 121X128X90 (DISPOSABLE) ×1 IMPLANT
DRAPE SURG 17X23 STRL (DRAPES) ×1 IMPLANT
GAUZE SPONGE 4X4 12PLY STRL (GAUZE/BANDAGES/DRESSINGS) ×1 IMPLANT
GAUZE STRETCH 2X75IN STRL (MISCELLANEOUS) IMPLANT
GAUZE XEROFORM 1X8 LF (GAUZE/BANDAGES/DRESSINGS) ×1 IMPLANT
GLOVE BIO SURGEON STRL SZ7.5 (GLOVE) ×1 IMPLANT
GLOVE BIOGEL PI IND STRL 8 (GLOVE) ×1 IMPLANT
GOWN STRL REUS W/ TWL LRG LVL3 (GOWN DISPOSABLE) ×1 IMPLANT
GOWN STRL REUS W/TWL LRG LVL3 (GOWN DISPOSABLE) ×1
GOWN STRL REUS W/TWL XL LVL3 (GOWN DISPOSABLE) ×1 IMPLANT
NDL HYPO 25X1 1.5 SAFETY (NEEDLE) ×1 IMPLANT
NEEDLE HYPO 25X1 1.5 SAFETY (NEEDLE) ×1 IMPLANT
NS IRRIG 1000ML POUR BTL (IV SOLUTION) ×1 IMPLANT
PACK BASIN DAY SURGERY FS (CUSTOM PROCEDURE TRAY) ×1 IMPLANT
PAD CAST 3X4 CTTN HI CHSV (CAST SUPPLIES) IMPLANT
PAD CAST 4YDX4 CTTN HI CHSV (CAST SUPPLIES) IMPLANT
PADDING CAST ABS COTTON 4X4 ST (CAST SUPPLIES) ×1 IMPLANT
PADDING CAST COTTON 3X4 STRL (CAST SUPPLIES)
PADDING CAST COTTON 4X4 STRL (CAST SUPPLIES)
SPLINT FINGER 3.25 911903 (SOFTGOODS) IMPLANT
STOCKINETTE 4X48 STRL (DRAPES) ×1 IMPLANT
STRIP CLOSURE SKIN 1/2X4 (GAUZE/BANDAGES/DRESSINGS) IMPLANT
SUT ETHILON 3 0 PS 1 (SUTURE) IMPLANT
SUT ETHILON 4 0 PS 2 18 (SUTURE) ×1 IMPLANT
SYR BULB EAR ULCER 3OZ GRN STR (SYRINGE) ×1 IMPLANT
SYR CONTROL 10ML LL (SYRINGE) ×1 IMPLANT
TOWEL GREEN STERILE FF (TOWEL DISPOSABLE) ×2 IMPLANT
UNDERPAD 30X36 HEAVY ABSORB (UNDERPADS AND DIAPERS) ×1 IMPLANT

## 2022-09-29 NOTE — H&P (Signed)
April Potts is an 52 y.o. female.   Chief Complaint: left ring finger mass HPI: 52 yo female with left ring finger mass.  It is bothersome to her.  She wishes to have it removed.  Allergies:  Allergies  Allergen Reactions   Ampicillin     History reviewed. No pertinent past medical history.  Past Surgical History:  Procedure Laterality Date   AUGMENTATION MAMMAPLASTY Bilateral    BREAST ENHANCEMENT SURGERY  05/2004   plactic sur     RECONSTRUCTION MID-FACE  09/2009   SKIN CANCER EXCISION  09/2009    Family History: Family History  Problem Relation Age of Onset   Hypertension Mother    Aneurysm Mother    Heart disease Father    Breast cancer Maternal Grandmother     Social History:   reports that she has quit smoking. She does not have any smokeless tobacco history on file. She reports current alcohol use. She reports that she does not use drugs.  Medications: Medications Prior to Admission  Medication Sig Dispense Refill   ibuprofen (ADVIL,MOTRIN) 200 MG tablet Take 400 mg by mouth every 6 (six) hours as needed for mild pain or moderate pain.     tretinoin (RETIN-A) 0.025 % cream Apply 1 application topically every other day.     valACYclovir (VALTREX) 500 MG tablet Take 500 mg by mouth 2 (two) times daily as needed (out breaks).       Results for orders placed or performed during the hospital encounter of 09/29/22 (from the past 48 hour(s))  Pregnancy, urine POC     Status: None   Collection Time: 09/29/22 12:19 PM  Result Value Ref Range   Preg Test, Ur NEGATIVE NEGATIVE    Comment:        THE SENSITIVITY OF THIS METHODOLOGY IS >24 mIU/mL     No results found.    Blood pressure 139/89, pulse 65, temperature 98.2 F (36.8 C), temperature source Oral, resp. rate 18, height 5\' 4"  (1.626 m), weight 51.8 kg, last menstrual period 08/25/2022, SpO2 100 %.  General appearance: alert, cooperative, and appears stated age Head: Normocephalic, without obvious  abnormality, atraumatic Neck: supple, symmetrical, trachea midline Extremities: Intact sensation and capillary refill all digits.  +epl/fpl/io.  No wounds. Palpable mass ulnar side left ring finger at pip joint level. Pulses: 2+ and symmetric Skin: Skin color, texture, turgor normal. No rashes or lesions Neurologic: Grossly normal Incision/Wound: none  Assessment/Plan Left ring finger mass.  Non operative and operative treatment options have been discussed with the patient and patient wishes to proceed with operative treatment. Risks, benefits, and alternatives of surgery have been discussed and the patient agrees with the plan of care.   Betha Loa 09/29/2022, 1:15 PM

## 2022-09-29 NOTE — Discharge Instructions (Addendum)
  Post Anesthesia Home Care Instructions  Activity: Get plenty of rest for the remainder of the day. A responsible individual must stay with you for 24 hours following the procedure.  For the next 24 hours, DO NOT: -Drive a car -Operate machinery -Drink alcoholic beverages -Take any medication unless instructed by your physician -Make any legal decisions or sign important papers.  Meals: Start with liquid foods such as gelatin or soup. Progress to regular foods as tolerated. Avoid greasy, spicy, heavy foods. If nausea and/or vomiting occur, drink only clear liquids until the nausea and/or vomiting subsides. Call your physician if vomiting continues.  Special Instructions/Symptoms: Your throat may feel dry or sore from the anesthesia or the breathing tube placed in your throat during surgery. If this causes discomfort, gargle with warm salt water. The discomfort should disappear within 24 hours.    [facility] Instructions Hand Surgery  Wound Care: Keep your hand elevated above the level of your heart.  Do not allow it to dangle by your side.  Keep the dressing dry and do not remove it unless your doctor advises you to do so.  He will usually change it at the time of your post-op visit.  Moving your fingers is advised to stimulate circulation but will depend on the site of your surgery.  If you have a splint applied, your doctor will advise you regarding movement.  Activity: Do not drive or operate machinery today.  Rest today and then you may return to your normal activity and work as indicated by your physician.  Diet:  Drink liquids today or eat a light diet.  You may resume a regular diet tomorrow.    General expectations: Pain for two to three days. Fingers may become slightly swollen.  Call your doctor if any of the following occur: Severe pain not relieved by pain medication. Elevated temperature. Dressing soaked with blood. Inability to move fingers. White or bluish  color to fingers.      

## 2022-09-29 NOTE — Transfer of Care (Signed)
Immediate Anesthesia Transfer of Care Note  Patient: April Potts [facility]  Procedure(s) Performed: EXCISION METACARPAL MASS LEFT RING FINGER (Left: Ring Finger)  Patient Location: PACU  Anesthesia Type:MAC  Level of Consciousness: awake, alert , and oriented  Airway & Oxygen Therapy: Patient Spontanous Breathing and Patient connected to face mask oxygen  Post-op Assessment: Report given to RN and Post -op Vital signs reviewed and stable  Post vital signs: Reviewed and stable  Last Vitals:  Vitals Value Taken Time  BP 115/65 09/29/22 1438  Temp    Pulse 67 09/29/22 1439  Resp 16 09/29/22 1439  SpO2 99 % 09/29/22 1439  Vitals shown include unvalidated device data.  Last Pain:  Vitals:   09/29/22 1231  TempSrc: Oral  PainSc: 0-No pain      Patients Stated Pain Goal: 7 (09/29/22 1231)  Complications: No notable events documented.

## 2022-09-29 NOTE — Op Note (Signed)
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   ASSISTANT: none   ANESTHESIA:  Local with sedation   INTRAVENOUS FLUIDS:  Per anesthesia flow sheet.   ESTIMATED BLOOD LOSS:  Minimal.   COMPLICATIONS:  None.   SPECIMENS: Left ring finger mass to pathology   TOURNIQUET TIME:    Total Tourniquet Time Documented: Forearm (Left) - 10 minutes Total: Forearm (Left) - 10 minutes    DISPOSITION:  Stable to PACU.   INDICATIONS: 52 year old female with mass in left ring finger.  It is bothersome to her.  She wishes to have it removed.  Risks, benefits and alternatives of surgery were discussed including the risks of blood loss, infection, damage to nerves, vessels, tendons, ligaments, bone for surgery, need for additional surgery, complications with wound healing, continued pain, stiffness, , recurrence.  She voiced understanding of these risks and elected to proceed.  OPERATIVE COURSE:  After being identified preoperatively by myself,  the patient and I agreed on the procedure and site of the procedure.  The surgical site was marked.  Surgical consent had been signed. Preoperative IV antibiotic prophylaxis was given. She was transferred to the operating room and placed on the operating table in supine position with the Left upper extremity on an arm board.  Sedation was induced by the anesthesiologist.a surgical pause was performed between the surgeons anesthesia and operating room staff and all were in agreement as the patient procedure and site procedure.  A digital block was performed to the left ring finger  with quarter percent plain Marcaine.  Left upper extremity was prepped and draped in normal sterile orthopedic fashion.  A surgical pause was performed between the surgeons, anesthesia, and operating room staff and all were in agreement as to the patient, procedure, and site of procedure.  Tourniquet at the proximal aspect of the forearm was inflated to 250 mmHg after exsanguination of the arm with an Esmarch bandage.  A Bruner type incision was made in the finger over the mass at the level of the PIP joint.  This was carried into subcutaneous tissues by spreading technique.  Bipolar electrocautery is used to obtain hemostasis.  The mass was easily identified.  It was dark purple in coloration.  It appeared vascular.  Feeding vessels were treated with bipolar electrocautery.  The mass was removed.  It measured 5 mm in diameter.  It was sent to pathology for examination.  The wound was copiously irrigated with sterile saline.  No remaining mass was noted.  The wound was closed with 4-0 nylon in a horizontal mattress fashion.  It was then dressed with sterile Xeroform 4 x 4 and wrapped with a Coban dressing lightly.  An AlumaFoam splint was placed and wrapped lightly with Coban dressing.  The tourniquet was deflated at 10 minutes.  Fingertips were pink with brisk capillary refill after deflation of tourniquet.  The operative  drapes were broken down.  The patient was awoken from anesthesia safely.  She was transferred back to the stretcher and taken to PACU in stable condition.  I will see her back in the office in 1 week for postoperative  followup.  I will give her a prescription for Tramadol 50 mg 1-2 tabs PO q6 hours prn pain, dispense # 20.   Betha Loa, MD Electronically signed, 09/29/22

## 2022-09-29 NOTE — Anesthesia Preprocedure Evaluation (Signed)
Anesthesia Evaluation  Patient identified by MRN, date of birth, ID band Patient awake    Reviewed: Allergy & Precautions, H&P , NPO status , Patient's Chart, lab work & pertinent test results  Airway Mallampati: II   Neck ROM: full    Dental   Pulmonary former smoker   breath sounds clear to auscultation       Cardiovascular negative cardio ROS  Rhythm:regular Rate:Normal     Neuro/Psych    GI/Hepatic   Endo/Other    Renal/GU      Musculoskeletal   Abdominal   Peds  Hematology   Anesthesia Other Findings   Reproductive/Obstetrics                             Anesthesia Physical Anesthesia Plan  ASA: 2  Anesthesia Plan: MAC   Post-op Pain Management:    Induction: Intravenous  PONV Risk Score and Plan: 2 and Propofol infusion, Midazolam and Treatment may vary due to age or medical condition  Airway Management Planned: Simple Face Mask  Additional Equipment:   Intra-op Plan:   Post-operative Plan:   Informed Consent: I have reviewed the patients History and Physical, chart, labs and discussed the procedure including the risks, benefits and alternatives for the proposed anesthesia with the patient or authorized representative who has indicated his/her understanding and acceptance.     Dental advisory given  Plan Discussed with: CRNA, Anesthesiologist and Surgeon  Anesthesia Plan Comments:        Anesthesia Quick Evaluation

## 2022-10-02 ENCOUNTER — Other Ambulatory Visit: Payer: Self-pay | Admitting: Obstetrics and Gynecology

## 2022-10-02 ENCOUNTER — Encounter (HOSPITAL_BASED_OUTPATIENT_CLINIC_OR_DEPARTMENT_OTHER): Payer: Self-pay | Admitting: Orthopedic Surgery

## 2022-10-02 DIAGNOSIS — Z9189 Other specified personal risk factors, not elsewhere classified: Secondary | ICD-10-CM

## 2022-10-02 LAB — SURGICAL PATHOLOGY

## 2022-10-03 NOTE — Anesthesia Postprocedure Evaluation (Signed)
Anesthesia Post Note  Patient: April Potts [facility]  Procedure(s) Performed: EXCISION METACARPAL MASS LEFT RING FINGER (Left: Ring Finger)     Patient location during evaluation: PACU Anesthesia Type: MAC Level of consciousness: awake and alert Pain management: pain level controlled Vital Signs Assessment: post-procedure vital signs reviewed and stable Respiratory status: spontaneous breathing, nonlabored ventilation, respiratory function stable and patient connected to nasal cannula oxygen Cardiovascular status: stable and blood pressure returned to baseline Postop Assessment: no apparent nausea or vomiting Anesthetic complications: no   No notable events documented.  Last Vitals:  Vitals:   09/29/22 1515 09/29/22 1527  BP:  136/79  Pulse: (!) 55 (!) 59  Resp: 10 16  Temp:  37.2 C  SpO2: 98% 98%    Last Pain:  Vitals:   09/29/22 1523  TempSrc:   PainSc: 0-No pain                 Tahtiana Rozier S

## 2022-11-03 ENCOUNTER — Other Ambulatory Visit: Payer: Self-pay | Admitting: Obstetrics and Gynecology

## 2022-11-03 DIAGNOSIS — N6489 Other specified disorders of breast: Secondary | ICD-10-CM

## 2022-11-23 ENCOUNTER — Other Ambulatory Visit: Payer: BC Managed Care – PPO

## 2022-12-07 ENCOUNTER — Other Ambulatory Visit: Payer: BC Managed Care – PPO

## 2022-12-13 ENCOUNTER — Other Ambulatory Visit: Payer: BC Managed Care – PPO

## 2022-12-23 IMAGING — US US BREAST*R* LIMITED INC AXILLA
1 series · 9 of 9 positions shown · non-contrast
Comparison: Previous exam(s).

CLINICAL DATA: 50-year-old female presenting for short-term
follow-up of probably benign findings in the right breast.

EXAM:
DIGITAL DIAGNOSTIC BILATERAL MAMMOGRAM WITH IMPLANTS, CAD AND
TOMOSYNTHESIS; ULTRASOUND RIGHT BREAST LIMITED
TECHNIQUE: Bilateral digital diagnostic mammography and breast tomosynthesis
was performed. The images were evaluated with computer-aided
detection. Standard and/or implant displaced views were performed.;
Targeted ultrasound examination of the right breast was performed

[Series 1: us breast*right* limited inc axilla · 0.05mm/px · 9 of 9 slices shown]
[im 1/9]
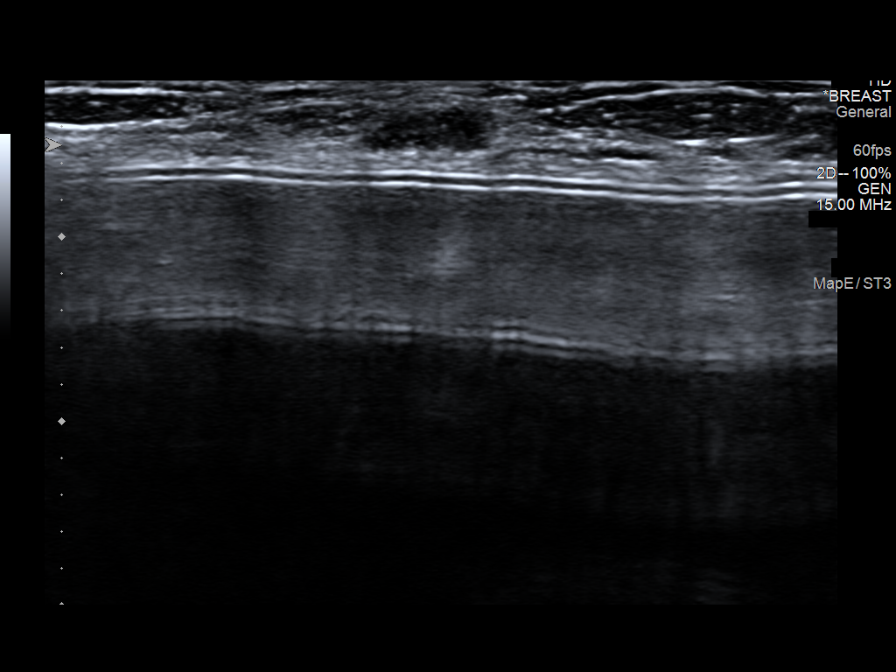
[im 2/9]
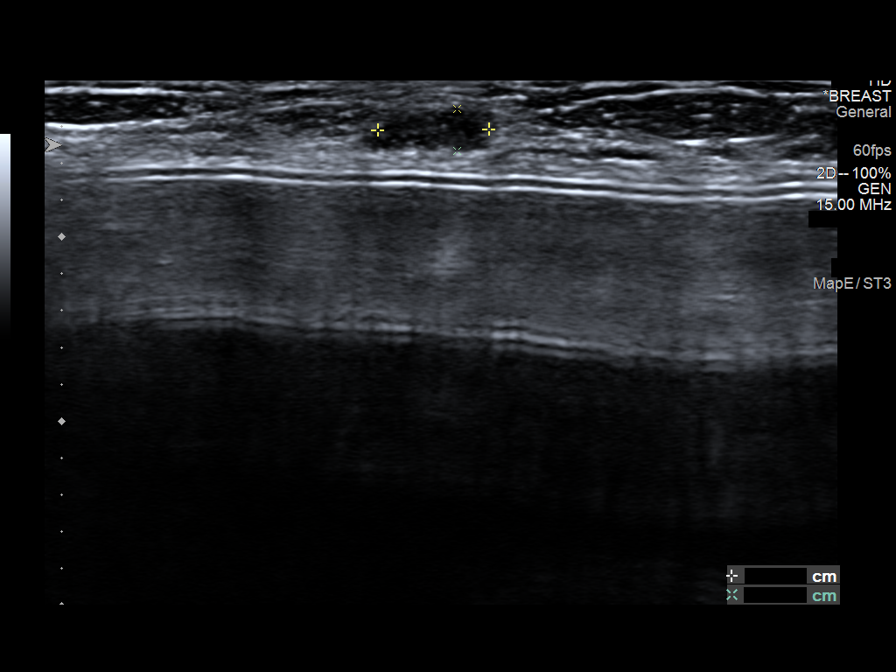
[im 3/9]
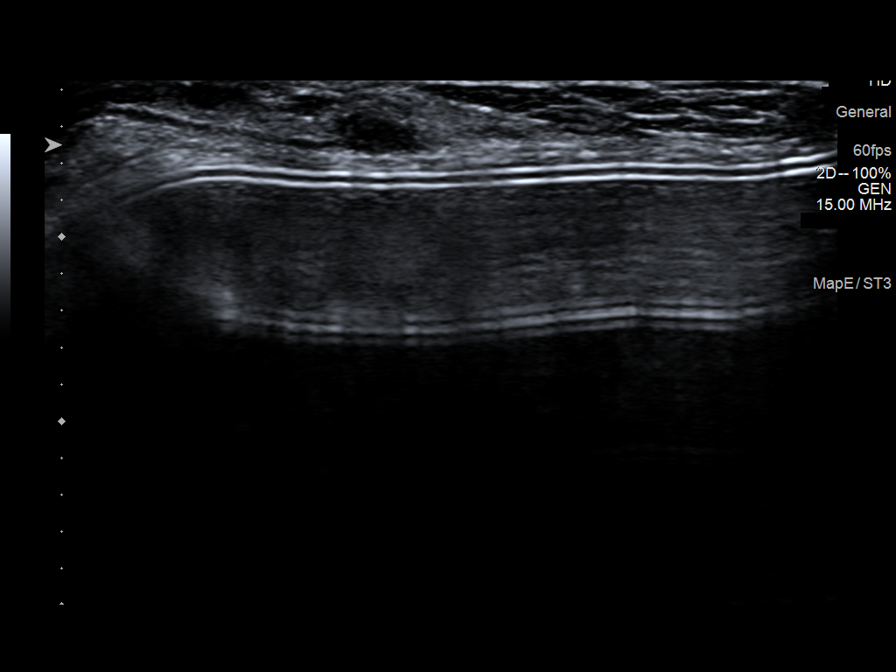
[im 4/9]
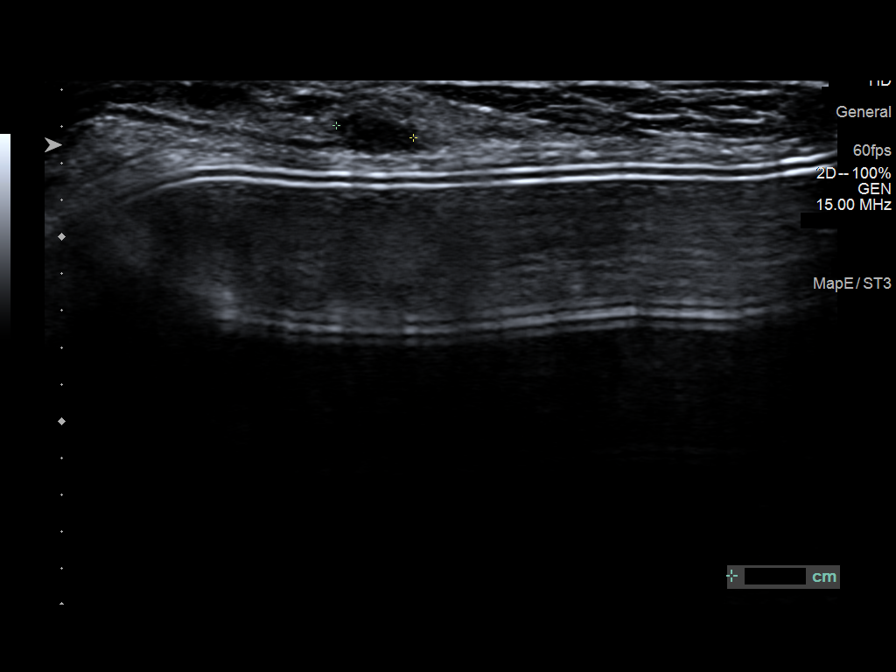
[im 5/9]
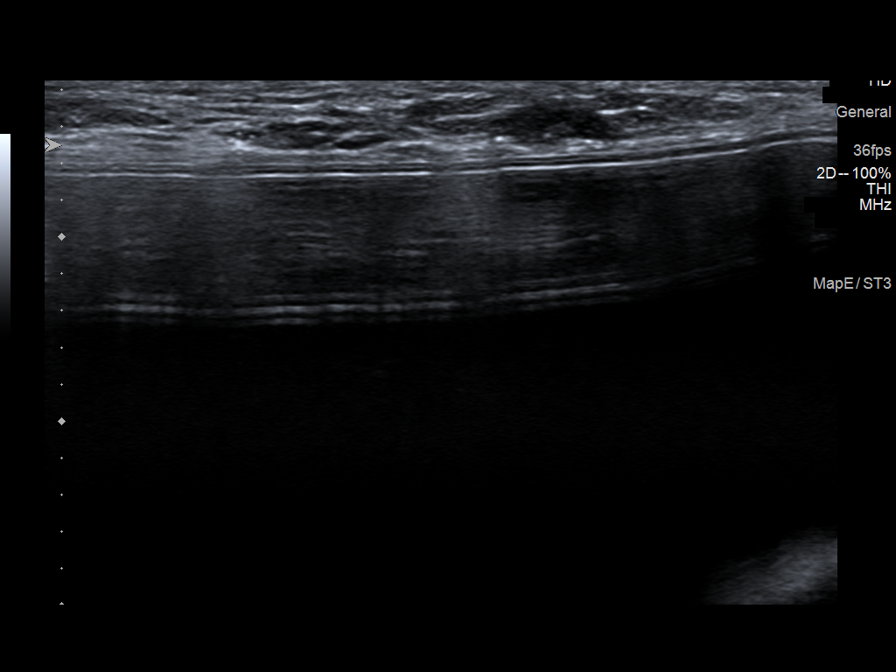
[im 6/9]
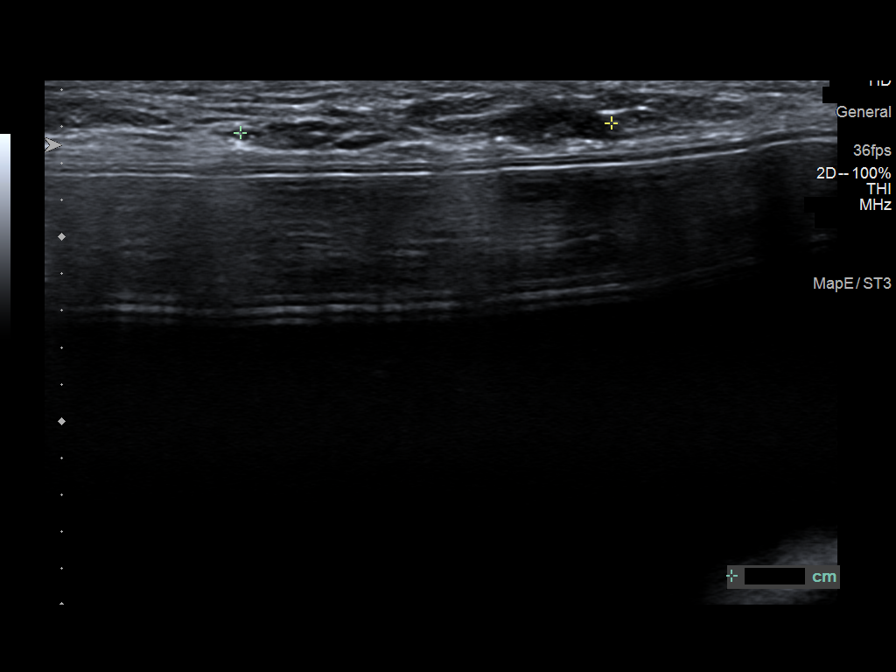
[im 7/9]
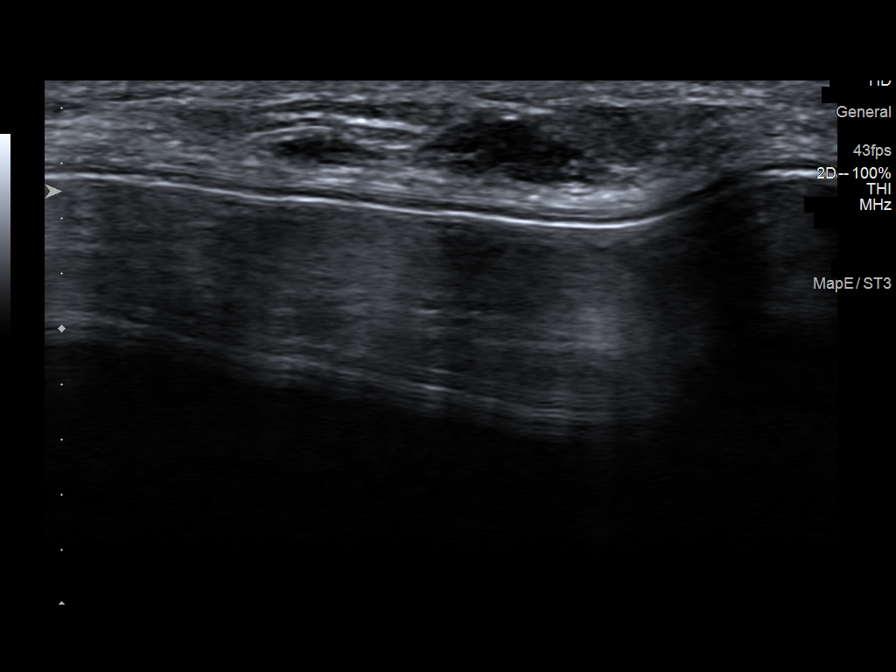
[im 8/9]
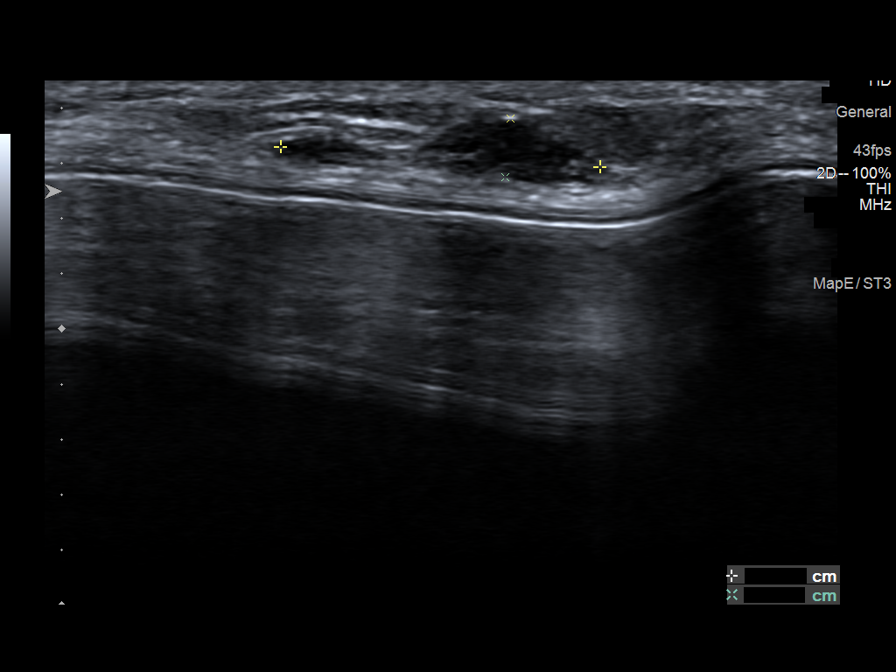
[im 9/9]
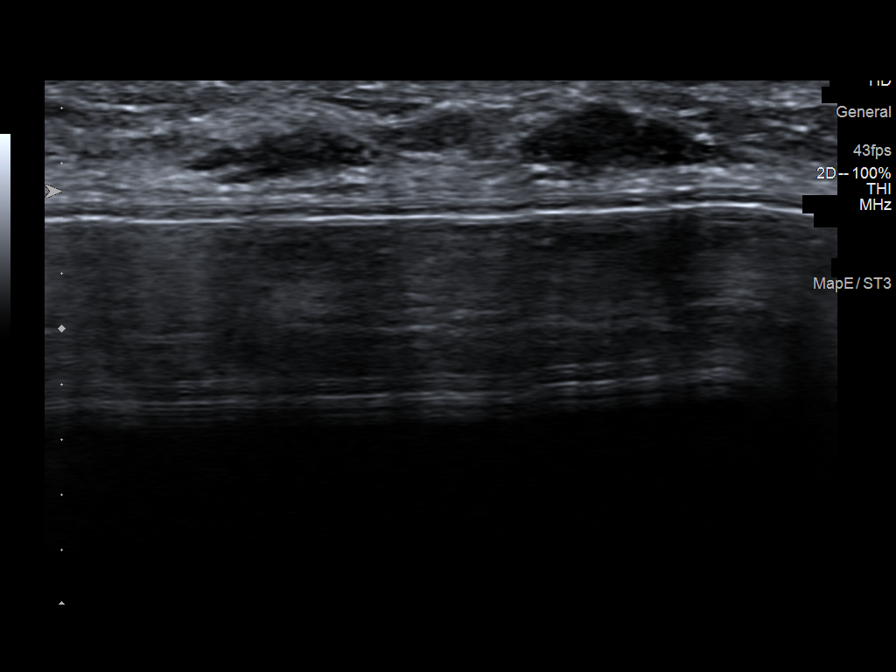

[9 of 9 positions shown; findings below may reference images not displayed]

ACR Breast Density Category c: The breast tissue is heterogeneously
dense, which may obscure small masses.
FINDINGS: Mammogram:

The patient has retropectoral implants.

Right breast: Stable appearance of an asymmetry in the medial aspect
of the right breast. There are no new suspicious findings elsewhere
in the right breast.

Left breast: No suspicious mass, distortion, or microcalcifications
are identified to suggest presence of malignancy.

Ultrasound:

Targeted ultrasound performed in the right breast at 4 o'clock
subareolar demonstrating an oval circumscribed hypoechoic mass
measuring 0.6 x 0.2 x 0.4 cm, previously measuring 0.6 x 0.2 x
cm.

At 4 o'clock 3 cm from the nipple there is an ill-defined hypoechoic
area most likely representing normal tissue which measures 2.0 x
x 1.2 cm, previously measuring 2.2 x 0.3 x 1.3 cm.
IMPRESSION: 1. Stable probably benign mass in the right breast at 4 o'clock
subareolar which has been stable dating back to January 2020.

2. Stable probably benign hypoechoic area in the right breast at 4
o'clock 3 cm from the nipple which is stable been dating back to
July 2020.

RECOMMENDATION:
Diagnostic bilateral mammogram and right breast ultrasound in 1
year.

I have discussed the findings and recommendations with the patient.
If applicable, a reminder letter will be sent to the patient
regarding the next appointment.

BI-RADS CATEGORY  3: Probably benign.

## 2023-01-25 ENCOUNTER — Ambulatory Visit
Admission: RE | Admit: 2023-01-25 | Discharge: 2023-01-25 | Disposition: A | Discharge status: Home / Self Care | Payer: BC Managed Care – PPO | Source: Ambulatory Visit | Attending: Obstetrics and Gynecology | Admitting: Obstetrics and Gynecology

## 2023-01-25 DIAGNOSIS — N6489 Other specified disorders of breast: Secondary | ICD-10-CM

## 2023-04-04 ENCOUNTER — Encounter: Payer: Self-pay | Admitting: Obstetrics and Gynecology

## 2023-04-10 ENCOUNTER — Ambulatory Visit
Admission: RE | Admit: 2023-04-10 | Discharge: 2023-04-10 | Disposition: A | Discharge status: Home / Self Care | Payer: BC Managed Care – PPO | Source: Ambulatory Visit | Attending: Obstetrics and Gynecology

## 2023-04-10 DIAGNOSIS — Z9189 Other specified personal risk factors, not elsewhere classified: Secondary | ICD-10-CM

## 2023-04-10 MED ORDER — GADOPICLENOL 0.5 MMOL/ML IV SOLN
5.0000 mL | Freq: Once | INTRAVENOUS | Status: AC | PRN
  Administered 2023-04-10: 5 mL via INTRAVENOUS

## 2023-10-26 ENCOUNTER — Other Ambulatory Visit: Payer: Self-pay | Admitting: Obstetrics and Gynecology

## 2023-10-26 DIAGNOSIS — Z9189 Other specified personal risk factors, not elsewhere classified: Secondary | ICD-10-CM
